# Patient Record
Sex: Female | Born: 1950 | Race: White | Hispanic: No | Marital: Married | Smoking: Former smoker
Health system: Southern US, Community
[De-identification: ages and names within clinical notes are randomized; demographics above are authoritative.]

## PROBLEM LIST (undated history)

## (undated) HISTORY — PX: BREAST EXCISIONAL BIOPSY: SUR124

---

## 1998-09-01 ENCOUNTER — Other Ambulatory Visit: Admission: RE | Admit: 1998-09-01 | Discharge: 1998-09-01 | Payer: Self-pay | Admitting: Family Medicine

## 1998-10-05 ENCOUNTER — Other Ambulatory Visit: Admission: RE | Admit: 1998-10-05 | Discharge: 1998-10-05 | Payer: Self-pay | Admitting: Otolaryngology

## 1998-11-16 ENCOUNTER — Encounter: Payer: Self-pay | Admitting: Otolaryngology

## 1998-11-18 ENCOUNTER — Inpatient Hospital Stay (HOSPITAL_COMMUNITY): Admission: RE | Admit: 1998-11-18 | Discharge: 1998-11-19 | Payer: Self-pay | Admitting: Otolaryngology

## 1999-11-07 ENCOUNTER — Encounter: Admission: RE | Admit: 1999-11-07 | Discharge: 1999-11-07 | Payer: Self-pay | Admitting: Family Medicine

## 1999-11-07 ENCOUNTER — Encounter: Payer: Self-pay | Admitting: Family Medicine

## 2000-09-05 ENCOUNTER — Encounter: Payer: Self-pay | Admitting: Otolaryngology

## 2000-09-05 ENCOUNTER — Encounter: Admission: RE | Admit: 2000-09-05 | Discharge: 2000-09-05 | Payer: Self-pay | Admitting: Otolaryngology

## 2000-11-14 ENCOUNTER — Encounter: Payer: Self-pay | Admitting: Family Medicine

## 2000-11-14 ENCOUNTER — Encounter: Admission: RE | Admit: 2000-11-14 | Discharge: 2000-11-14 | Payer: Self-pay | Admitting: Family Medicine

## 2001-04-07 ENCOUNTER — Ambulatory Visit (HOSPITAL_COMMUNITY): Admission: RE | Admit: 2001-04-07 | Discharge: 2001-04-07 | Payer: Self-pay | Admitting: Gastroenterology

## 2001-04-16 ENCOUNTER — Encounter: Admission: RE | Admit: 2001-04-16 | Discharge: 2001-04-16 | Payer: Self-pay | Admitting: Gastroenterology

## 2001-04-16 ENCOUNTER — Encounter: Payer: Self-pay | Admitting: Gastroenterology

## 2001-04-29 ENCOUNTER — Encounter (INDEPENDENT_AMBULATORY_CARE_PROVIDER_SITE_OTHER): Payer: Self-pay | Admitting: Specialist

## 2001-04-29 ENCOUNTER — Ambulatory Visit (HOSPITAL_COMMUNITY): Admission: RE | Admit: 2001-04-29 | Discharge: 2001-04-29 | Payer: Self-pay | Admitting: Gastroenterology

## 2001-07-01 ENCOUNTER — Encounter: Payer: Self-pay | Admitting: General Surgery

## 2001-07-04 ENCOUNTER — Encounter (INDEPENDENT_AMBULATORY_CARE_PROVIDER_SITE_OTHER): Payer: Self-pay | Admitting: Specialist

## 2001-07-04 ENCOUNTER — Inpatient Hospital Stay (HOSPITAL_COMMUNITY): Admission: RE | Admit: 2001-07-04 | Discharge: 2001-07-10 | Payer: Self-pay | Admitting: General Surgery

## 2001-07-06 ENCOUNTER — Encounter: Payer: Self-pay | Admitting: Surgery

## 2001-11-17 ENCOUNTER — Encounter: Payer: Self-pay | Admitting: Family Medicine

## 2001-11-17 ENCOUNTER — Encounter: Admission: RE | Admit: 2001-11-17 | Discharge: 2001-11-17 | Payer: Self-pay | Admitting: Family Medicine

## 2002-01-29 ENCOUNTER — Inpatient Hospital Stay (HOSPITAL_COMMUNITY): Admission: AD | Admit: 2002-01-29 | Discharge: 2002-01-30 | Payer: Self-pay | Admitting: General Surgery

## 2002-10-02 ENCOUNTER — Encounter: Payer: Self-pay | Admitting: Otolaryngology

## 2002-10-02 ENCOUNTER — Encounter: Admission: RE | Admit: 2002-10-02 | Discharge: 2002-10-02 | Payer: Self-pay | Admitting: Otolaryngology

## 2002-11-23 ENCOUNTER — Encounter: Admission: RE | Admit: 2002-11-23 | Discharge: 2002-11-23 | Payer: Self-pay | Admitting: Family Medicine

## 2002-11-23 ENCOUNTER — Encounter: Payer: Self-pay | Admitting: Family Medicine

## 2003-12-13 ENCOUNTER — Encounter: Admission: RE | Admit: 2003-12-13 | Discharge: 2003-12-13 | Payer: Self-pay | Admitting: Family Medicine

## 2005-01-08 ENCOUNTER — Encounter: Admission: RE | Admit: 2005-01-08 | Discharge: 2005-01-08 | Payer: Self-pay | Admitting: *Deleted

## 2006-01-15 ENCOUNTER — Encounter: Admission: RE | Admit: 2006-01-15 | Discharge: 2006-01-15 | Payer: Self-pay | Admitting: *Deleted

## 2007-01-20 ENCOUNTER — Ambulatory Visit (HOSPITAL_COMMUNITY): Admission: RE | Admit: 2007-01-20 | Discharge: 2007-01-20 | Payer: Self-pay | Admitting: Family Medicine

## 2008-01-22 ENCOUNTER — Ambulatory Visit (HOSPITAL_COMMUNITY): Admission: RE | Admit: 2008-01-22 | Discharge: 2008-01-22 | Payer: Self-pay | Admitting: Family Medicine

## 2009-01-24 ENCOUNTER — Ambulatory Visit (HOSPITAL_COMMUNITY): Admission: RE | Admit: 2009-01-24 | Discharge: 2009-01-24 | Payer: Self-pay | Admitting: Family Medicine

## 2010-01-27 ENCOUNTER — Ambulatory Visit (HOSPITAL_COMMUNITY)
Admission: RE | Admit: 2010-01-27 | Discharge: 2010-01-27 | Payer: Self-pay | Source: Home / Self Care | Attending: Family Medicine | Admitting: Family Medicine

## 2010-06-23 NOTE — Procedures (Signed)
[place]. [facility]  Patient:    Melinda Lam, Melinda Lam Visit Number: [id number] MRN: [id number]          Service Type: END Location: ENDO Attending Physician:  Charna Elizabeth Dictated by:   Anselmo Rod, M.D. Proc. Date: 04/29/01 Admit Date:  04/29/2001   CC:         Jefry H. Pollyann Kennedy, M.D.   Procedure Report  DATE OF BIRTH:  January 12, 1951  REFERRING PHYSICIAN:  Jefry H. Pollyann Kennedy, M.D.  PROCEDURE PERFORMED:  Esophagogastroduodenoscopy with biopsies.  ENDOSCOPIST:  Anselmo Rod, M.D.  INSTRUMENT USED:  Olympus video panendoscope.  INDICATIONS FOR PROCEDURE:  Dysphagia with a history of stricture seen with barium esophagogram in a 60 year old white female with a history of severe reflux, rule out esophagitis.  Dilatation is planned if necessary.  PREPROCEDURE PREPARATION:  Informed consent was procured from the patient. The patient was fasted for eight hours prior to the procedure.  PREPROCEDURE PHYSICAL:  The patient had stable vital signs.  Neck supple. Chest clear to auscultation.  S1, S2 regular.  Abdomen soft with normal abdominal bowel sounds.  DESCRIPTION OF PROCEDURE:  The patient was placed in left lateral decubitus position and sedated with 50 mg of Demerol and 7.5 mg of Versed intravenously. Once the patient was adequately sedated and maintained on low-flow oxygen and continuous cardiac monitoring, the Olympus video panendoscope was advanced through the mouthpiece, over the tongue, into the esophagus under direct vision.  The esophagus appeared widely patent. There was no evidence of ring, stricture, masses, esophagitis or Barretts mucosa.  The scope was then advanced to the stomach.  No esophageal stricture was noted. Antral erosions were noted on examination of the prepyloric area.  Biopsies were done to rule out presence of  Helicobacter pylori by pathology. The proximal small bowel appeared normal.  The rest of the stomach appeared  normal.  IMPRESSION: 1. Widely patent esophagus, no evidence of stricture, esophagitis or Barretts    mucosa. 2. Antral erosions biopsied.  Rule out Helicobacter pylori by pathology. 3. Normal proximal small bowel.  RECOMMENDATION: 1. Continue proton pump inhibitors. 2. Treat with antibiotics if Helicobacter pylori present. 3. Surgical evaluation for Nissen fundoplication. 4. Outpatient follow-up in the next four weeks. Dictated by:   Anselmo Rod, M.D. Attending Physician:  Charna Elizabeth DD:  04/29/01 TD:  04/29/01 Job: [id number]

## 2010-06-23 NOTE — Op Note (Signed)
[facility]  Patient:    Melinda Lam, Melinda Lam Visit Number: [id number] MRN: [id number]          Service Type: SUR Location: 1W 0164 01 Attending Physician:  Arlis Porta Dictated by:   Adolph Pollack, M.D. Proc. Date: 07/04/01 Admit Date:  07/04/2001   CC:         Anselmo Rod, M.D.  Jefry H. Pollyann Kennedy, M.D.  Robert A. Eliezer Lofts., M.D.   Operative Report  PREOPERATIVE DIAGNOSIS:  Hiatal hernia with gastroesophageal reflux.  POSTOPERATIVE DIAGNOSIS:  Hiatal hernia with gastroesophageal reflux.  PROCEDURE:  Laparoscopic converted to open Nissen fundoplication and splenectomy.  SURGEON:  Adolph Pollack, M.D.  ASSISTANT:  Sandria Bales. Ezzard Standing, M.D.  ANESTHESIA:  General.  ESTIMATED BLOOD LOSS:  Approximately 2000 to 2500 cc.  INDICATIONS FOR PROCEDURE:  [name] is a 60 year old female whose been suffering from symptoms of gastroesophageal reflux as well as supra-esophageal manifestations. She is on twice a day Nexium but still has break through symptoms. She has an abnormal demeester score. She has to sleep in a recliner. Manometry does not demonstrate esophageal dismotility. She has elected to have the antireflux surgery.  TECHNIQUE:  She was placed supine on the operating table and a general anesthetic was administered. The abdomen was sterilely prepped and draped. A small incision was made superior to the umbilicus in the midline and carried down through subcutaneous tissue to the fascia. A 1.5 cm incision was made in the midline fascia and a pursestring suture of #0 Vicryl placed around the fascial edges. The peritoneal cavity was entered bluntly and under direct vision. A Hasson trocar was introduced into the peritoneal cavity and pneumoperitoneum was created by insufflation of CO2 gas. The laparoscope was introduced into the peritoneal cavity and a 12 mm trocar was placed in the right lateral abdomen through which a liver  retraction device was inserted. She has enlarged fatty liver. I retracted the left lobe of the liver up to the shoulder exposing the hiatus. Next, a 5 mm trocar was placed in the right upper quadrant followed by an 11 mm trocar in the midline epigastric region. A 5 mm trocar was placed in the left upper quadrant.  The thin gastrohepatic ligament was divided with the harmonic scalpel up to the level of the right crus. I then incised the peritoneum over the esophagus exposing the left crus. Using careful blunt dissection, I separated the esophagus from the right crus. I identified the posterior vagus nerve and left it adherent to the esophagus. I then partially created a retroesophageal window using blunt dissection.  Next, I approached the short gastric vessels. I found an area approximately in the mid fundus and began dividing short gastric vessels using the harmonic scalpel. When I came to the proximal fundus, it was intimately adherent to the spleen. Again carefully dissecting in this area and taking small bites with the harmonic scalpel, significant bleeding was encountered. I initially tried to place a clip on what I felt was a bleeding vessel but this was unsuccessful. I placed a sponge and held direct pressure. I was not able to control the bleeding adequately laparoscopically so I decided to convert the procedure to open.  An upper midline incision was made incising the skin and subcutaneous sharply. The midline fascia was divided with the cautery and the peritoneal cavity entered. I immediately packed off the area that was bleeding. I used self retaining retractors to expose. I then removed the packs  and noted significant bleeding deep near the splenic hilar area. I could not adequately visualize the vessel that the bleeding was coming from and I felt the only way I was going to be able to do this would be to remove the spleen as I could not control the bleeding at this  time.  Next, I held direct pressure over the vessel and tacked it tightly. I allowed the anesthesiologist to resuscitate the patient. She only had one episode of hypotension with blood pressure systolic of 78 but the pulse remained normal during that time. She was typed and crossed for four units of blood and was given two units of blood. Following this, I mobilized the spleen by dividing its lateral attachments. I then clamped the hilar attachments and divided these. I divided the short gastric vessels and removed the spleen. This allowed me adequate exposure to the area. I ligated the splenic vessels and the short gastric vessels. I also placed clips on some of the short gastric vessels. There was a small amount of oozing in the tail of the pancreas and I cauterized this.  Next, I exposed the left crus area and I found a dilated vein that was significantly bleeding. This appeared to be the structure responsible for the bleeding. 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 I clipped it proximally and distally and controlled the bleeding. At this point in time, we had lost probably approximately 2000 cc of blood but receiving 2 units of blood, she remained hemodynamically stable and was making good urine output. Hemoglobin check after the two units of blood was 9.1.  I completed the retroesophageal window and retracted this with a Penrose drain exposing the left and right crus. A small hiatal hernia was noted and was closed with two #0 nonabsorbable sutures. Next, I passed part of the fundus through the retroesophageal window to create a 360 degree wrap which was under no tension. A size 50 dilator was placed into the esophagus and into the stomach. A 360 degree fundoplication was then performed using #0 interrupted sutures. The wrap was 2 cm in length. The proximal sutures incorporated bites  of the left leaf of the wrap, the esophagus and the right leaf of the wrap. The distal suture  incorporated just the right and left leaves of the wrap. I had the dilator removed and the wrap was floppy under no tension. I then had a nasogastric tube placed into the stomach.  I irrigated out the region in the splenic bed. There was a small residual piece of splenic tissue left behind that was not bleeding and I did not remove it. The splenic bed was hemostatic. I then placed a drain into the splenic bed near the tail of the pancreas through one of the 5 mm trocar sites on the left upper quadrant and anchored it to the skin with 3-0 nylon suture. I removed all laparotomy pads and made sure laparotomy, sponge and instrument count was correct. After that had been reported as being correct, the self retaining retractors were removed. I then closed the fascia with a running #1 PDS suture and intermittent #1 Vicryl sutures. The subcutaneous tissue was irrigated. The remaining trocar sites in the midline wound were closed with staples. Sterile dressings were applied.  The patient tolerated the procedure fairly well. 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 However, she was taken to the recovery room  in satisfactory condition. She will spend overnight in the Step-Down Unit for observation. I did explain to her husband midway through the operation what we would need to do and the fact that we had to convert to an open procedure and that I eventually gave her a blood transfusion. Dictated by:   Adolph Pollack, M.D. Attending Physician:  Arlis Porta DD:  07/04/01 TD:  07/05/01 Job: [id number]

## 2010-06-23 NOTE — Op Note (Signed)
NAME:  Melinda Lam, Melinda Lam                       ACCOUNT NO.:  [id number]   MEDICAL RECORD NO.:  [id number]                   PATIENT TYPE:  OBV   LOCATION:  0377                                 FACILITY:  [facility]   PHYSICIAN:  Adolph Pollack, M.D.            DATE OF BIRTH:  [birth year]   DATE OF PROCEDURE:  01/28/2002  DATE OF DISCHARGE:                                 OPERATIVE REPORT   PREOPERATIVE DIAGNOSIS:  Ventral incisional hernia.   POSTOPERATIVE DIAGNOSIS:  Ventral incisional hernia.   PROCEDURE:  Laparoscopic repair of ventral incisional hernia with Gore-Tex  Dual mesh.   SURGEON:  Adolph Pollack, M.D.   ASSISTANT:  Rose Phi. Maple Hudson, M.D.   ANESTHESIA:  General endotracheal.   INDICATION:  The patient is a 60 year old female who had an open Nissen  fundoplication and developed a ventral incisional hernia in the epigastric  region.  The pain has been increasing, and she requests repair.  The  procedure and risks were explained to her preoperatively.   TECHNIQUE:  She was brought to the operating room and placed supine on the  operating table, and a general anesthetic was administered.  A Foley  catheter was the placed in her bladder.  Her abdominal wall was sterilely  prepped and draped.  A small subumbilical incision was made incising the  skin sharply and carrying this down to the midline fascia where a small  incision was made in the midline fascia.  The peritoneal cavity was then  entered under direct vision.  A pursestring suture of 0 Vicryl was placed  around the fascia about this.  A Hasson trocar was introduced into the  peritoneal cavity, and pneumoperitoneum was created by insufflation of CO2  gas.   Next, the laparoscope was introduced, and it appeared that I was subomental.  I was able to find a spot in the right mid-lateral abdomen, and placed a  10/11-mm trocar here and also placed one in the left mid-lateral abdomen.  From the right mid-lateral  abdomen, I was able to visualize the omental  adhesions to the anterior abdominal wall.  I placed a 5-mm trocar in the  right upper quadrant and using careful sharp and blunt dissection, I freed  up the anterior abdominal wall from all the omental adhesions.  There was no  adherent bowel to the abdominal wall.  I also divided part of the falciform  ligament close to the abdominal wall using cautery.  I identified two hernia  defects, one in the supraumbilical area and one in the midepigastric region.  I cleaned off the abdominal wall of the adhesions well around these areas.  I took spinal needles and placed them at the periphery of the hernia in four  quadrants, then measured 3-4 cm out from this drawing a large oval.  I then  brought a piece of Gore-Tex Dual mesh in.  The mesh I used was approximately  17 x 22 cm.  I cut the mesh to fit the oval then placed four anchoring  sutures of 0 Novofil in each of the four quadrants.  I then placed the mesh  into the abdominal cavity through the subumbilical port, and then I closed  the subumbilical port by tightening up and tying down the pursestring  suture.  Intracorporeally, I unfurled the mesh with the smooth side facing  down and the rough side facing up.  In the four quadrants of the hernia  area, I placed four small incisions and brought the anchoring sutures up  over a fascial bridge and approximated the mesh to the anterior abdominal  wall by tightening up and tying down the anchoring sutures.  I then took  spiral tacks and placed a spiral tacks in each quadrant to further anchor  the mesh.   Next, I made small incisions bisecting the previous four quadrant incisions  I had made and placed four more anchoring sutures using the Endoclose device  into the mesh.  I then completed the fixation with more spiral tacks in the  periphery and inner ring.  This allowed for more than adequate coverage of  the two defects.   I inspected the area,  and there was no active bleeding noted.  I did  evacuate approximately 50 cc of some old clot.  I then released the  pneumoperitoneum and watched the mesh approximate itself to the omentum.  I  removed all the trocars.   All skin incisions were then closed with 4-0 Monocryl subcuticular stitches.  Steri-Strips and sterile dressings were applied.   She tolerated the procedure well without any apparent complications and was  taken to the recovery room in satisfactory condition.                                               Adolph Pollack, M.D.    [name]  D:  01/28/2002  T:  01/29/2002  Job:  [id number]

## 2010-06-23 NOTE — Discharge Summary (Signed)
   NAME:  Melinda Lam, Melinda Lam                       ACCOUNT NO.:  [id number]   MEDICAL RECORD NO.:  [id number]                   PATIENT TYPE:  INP   LOCATION:  0377                                 FACILITY:  [facility]   PHYSICIAN:  Adolph Pollack, M.D.            DATE OF BIRTH:  03-Mar-1950   DATE OF ADMISSION:  01/28/2002  DATE OF DISCHARGE:  01/30/2002                                 DISCHARGE SUMMARY   PRIMARY DISCHARGE DIAGNOSIS:  Ventral incisional hernia.   SECONDARY DIAGNOSES:  1. Hypertension.  2. Hiatal hernia with gastroesophageal reflux disease.   PROCEDURE:  Laparoscopic ventral incisional hernia repair with mesh on  January 28, 2002.   REASON FOR ADMISSION:  The patient is a 60 year old female who had an open  Nissan fundoplication for gastroesophageal reflux disease.  She has had  alleviation of her symptoms but has developed a painful incisional hernia  and was admitted for repair.   HOSPITAL COURSE:  She underwent the above procedure without any apparent  complications.  Postoperatively, she did have some pain control issues and  was not comfortable enough to go home on postoperative day #1.  Her Foley  was removed and by postoperative day #2 she was more comfortable, tolerating  a diet, and able to be discharged.   DISPOSITION:  1. Discharged to home on January 30, 2002 in satisfactory condition.  2. She was given Tylox for pain and told to continue her home medications.  3. Discharge instructions were given to her.  4. She will follow up with me in the office.                                               Adolph Pollack, M.D.    [name]  D:  02/09/2002  T:  02/09/2002  Job:  [id number]

## 2010-06-23 NOTE — Discharge Summary (Signed)
[facility]  Patient:    Melinda Lam, Melinda Lam Visit Number: [id number] MRN: [id number]          Service Type: SUR Location: 4W 0464 01 Attending Physician:  Arlis Porta Dictated by:   Adolph Pollack, M.D. Admit Date:  07/04/2001 Discharge Date: 07/10/2001   CC:         Anselmo Rod, M.D.  Jefry H. Pollyann Kennedy, M.D.  Robert A. Eliezer Lofts., M.D.   Discharge Summary  PRINCIPAL DISCHARGE DIAGNOSIS:  Hiatal hernia with gastroesophageal reflux.  SECONDARY DIAGNOSES: 1. Hypertension. 2. Acute blood loss anemia.  PROCEDURE:  Laparoscopic converted to open Nissen fundoplication with splenectomy Jul 04, 2001.  REASON FOR ADMISSION:  The patient is a 60 year old female suffering from gastroesophageal reflux who has failed medical management.  She has some superesophageal manifestations of a reflux as well.  Because of her failure of medical management, she was admitted for elective Nissen fundoplication.  HOSPITAL COURSE:  She underwent the above operation with the complication of a bleeding aberrant left phrenic vessel leading to a significant intraoperative blood loss and requiring transfusion.  In order to expose this bleeding vessel and stop the hemorrhage, the spleen was mobilized and then ended up having to be removed however, a small segment of spleen remained measuring approximately 2-3 cm in diameter.  Postoperatively, she was hypokalemic and this was replaced.  She got 2 units of blood intraoperatively and 2 units postoperatively on the floor.  Her NG tube was removed.  She was started on a liquid diet.  I had ordered meningococcus vaccine, pneumococcus vaccine, and Haemophilus influenzae vaccine.  I was informed by the pharmacy that all vaccines were on back order.  I talked with the [facility] pharmacist and with the [facility] pharmacist.  The [facility] pharmacist and supplier told me that they had pneumococcus vaccine but did not have the  Haemophilus or meningococcus vaccine.  I requested the pneumococcus vaccine be given a second time and the patient informed me that she received it July 09, 2001.  She was given one dose of Procrit and started on iron.  She was improving with respect to her ambulatory status, improving with respect to her oral intake of her liquid diet.  A drain fluid amylase was normal at 48.  Hemoglobin the day before discharge was 9100 with a platelet count of 514,000.  On July 10, 2001, she had continued improvement, was more independent with respect to her ambulatory nature and was felt to be satisfactory for discharge.  Her drain was removed.  DISPOSITION:  Discharged to home on July 10, 2001.  Staples will be removed prior to discharge and Steri-Strips applied.  I gave her multiple instructions including her needing a pneumococcal vaccine every 5 years with the next one due June 2008.  She was given Tylox for pain, Niferex for iron replacement and also ampicillin 500 mg.  I told her if she had febrile illness for more than 24 hours, fever being greater than 101 or 101.5, that she needed to have the ampicillin prescription filled, start taking it and call her primary care doctor.  She will come back and see me in 2 weeks.  Her diet is basically full liquids and "blenderized" food.  She was discharged in satisfactory condition. Dictated by:   Adolph Pollack, M.D. Attending Physician:  Arlis Porta DD:  07/10/01 TD:  07/13/01 Job: [id number]

## 2011-01-08 ENCOUNTER — Other Ambulatory Visit (HOSPITAL_COMMUNITY): Payer: Self-pay | Admitting: Family Medicine

## 2011-01-08 DIAGNOSIS — Z1231 Encounter for screening mammogram for malignant neoplasm of breast: Secondary | ICD-10-CM

## 2011-02-14 ENCOUNTER — Ambulatory Visit (HOSPITAL_COMMUNITY)
Admission: RE | Admit: 2011-02-14 | Discharge: 2011-02-14 | Disposition: A | Discharge status: Home / Self Care | Payer: Self-pay | Source: Ambulatory Visit | Attending: Family Medicine | Admitting: Family Medicine

## 2011-02-14 DIAGNOSIS — Z1231 Encounter for screening mammogram for malignant neoplasm of breast: Secondary | ICD-10-CM

## 2012-04-16 ENCOUNTER — Other Ambulatory Visit: Payer: Self-pay | Admitting: Physician Assistant

## 2012-04-16 ENCOUNTER — Ambulatory Visit
Admission: RE | Admit: 2012-04-16 | Discharge: 2012-04-16 | Disposition: A | Discharge status: Home / Self Care | Payer: BC Managed Care – PPO | Source: Ambulatory Visit | Attending: Physician Assistant | Admitting: Physician Assistant

## 2012-04-16 DIAGNOSIS — R05 Cough: Secondary | ICD-10-CM

## 2012-04-16 DIAGNOSIS — R1030 Lower abdominal pain, unspecified: Secondary | ICD-10-CM

## 2012-04-24 ENCOUNTER — Other Ambulatory Visit (HOSPITAL_COMMUNITY): Payer: Self-pay | Admitting: Family Medicine

## 2012-04-24 DIAGNOSIS — Z1231 Encounter for screening mammogram for malignant neoplasm of breast: Secondary | ICD-10-CM

## 2012-05-01 ENCOUNTER — Ambulatory Visit (HOSPITAL_COMMUNITY)
Admission: RE | Admit: 2012-05-01 | Discharge: 2012-05-01 | Disposition: A | Discharge status: Home / Self Care | Payer: BC Managed Care – PPO | Source: Ambulatory Visit | Attending: Family Medicine | Admitting: Family Medicine

## 2012-05-01 DIAGNOSIS — Z1231 Encounter for screening mammogram for malignant neoplasm of breast: Secondary | ICD-10-CM | POA: Insufficient documentation

## 2012-10-22 ENCOUNTER — Other Ambulatory Visit: Payer: Self-pay | Admitting: Gastroenterology

## 2013-03-31 ENCOUNTER — Other Ambulatory Visit (HOSPITAL_COMMUNITY): Payer: Self-pay | Admitting: Family Medicine

## 2013-03-31 DIAGNOSIS — Z1231 Encounter for screening mammogram for malignant neoplasm of breast: Secondary | ICD-10-CM

## 2013-05-04 ENCOUNTER — Ambulatory Visit (HOSPITAL_COMMUNITY)
Admission: RE | Admit: 2013-05-04 | Discharge: 2013-05-04 | Disposition: A | Discharge status: Home / Self Care | Payer: BC Managed Care – PPO | Source: Ambulatory Visit | Attending: Family Medicine | Admitting: Family Medicine

## 2013-05-04 DIAGNOSIS — Z1231 Encounter for screening mammogram for malignant neoplasm of breast: Secondary | ICD-10-CM | POA: Insufficient documentation

## 2014-01-19 ENCOUNTER — Other Ambulatory Visit: Payer: Self-pay | Admitting: Family Medicine

## 2014-03-30 ENCOUNTER — Other Ambulatory Visit (HOSPITAL_COMMUNITY): Payer: Self-pay | Admitting: Family Medicine

## 2014-03-30 DIAGNOSIS — Z139 Encounter for screening, unspecified: Secondary | ICD-10-CM

## 2014-05-10 ENCOUNTER — Ambulatory Visit (HOSPITAL_COMMUNITY)
Admission: RE | Admit: 2014-05-10 | Discharge: 2014-05-10 | Disposition: A | Discharge status: Home / Self Care | Payer: BLUE CROSS/BLUE SHIELD | Source: Ambulatory Visit | Attending: Family Medicine | Admitting: Family Medicine

## 2014-05-10 DIAGNOSIS — Z1231 Encounter for screening mammogram for malignant neoplasm of breast: Secondary | ICD-10-CM | POA: Diagnosis not present

## 2014-05-10 DIAGNOSIS — Z139 Encounter for screening, unspecified: Secondary | ICD-10-CM

## 2015-04-06 ENCOUNTER — Other Ambulatory Visit: Payer: Self-pay

## 2015-04-06 DIAGNOSIS — Z1231 Encounter for screening mammogram for malignant neoplasm of breast: Secondary | ICD-10-CM

## 2015-05-11 ENCOUNTER — Ambulatory Visit
Admission: RE | Admit: 2015-05-11 | Discharge: 2015-05-11 | Disposition: A | Discharge status: Home / Self Care | Payer: BLUE CROSS/BLUE SHIELD | Source: Ambulatory Visit

## 2015-05-11 DIAGNOSIS — Z1231 Encounter for screening mammogram for malignant neoplasm of breast: Secondary | ICD-10-CM

## 2016-04-02 ENCOUNTER — Other Ambulatory Visit: Payer: Self-pay | Admitting: Family Medicine

## 2016-04-02 DIAGNOSIS — Z1231 Encounter for screening mammogram for malignant neoplasm of breast: Secondary | ICD-10-CM

## 2016-05-14 ENCOUNTER — Ambulatory Visit
Admission: RE | Admit: 2016-05-14 | Discharge: 2016-05-14 | Disposition: A | Discharge status: Home / Self Care | Payer: BLUE CROSS/BLUE SHIELD | Source: Ambulatory Visit | Attending: Family Medicine | Admitting: Family Medicine

## 2016-05-14 DIAGNOSIS — Z1231 Encounter for screening mammogram for malignant neoplasm of breast: Secondary | ICD-10-CM

## 2016-12-25 ENCOUNTER — Other Ambulatory Visit: Payer: Self-pay | Admitting: Family Medicine

## 2016-12-25 DIAGNOSIS — E2839 Other primary ovarian failure: Secondary | ICD-10-CM

## 2016-12-26 ENCOUNTER — Ambulatory Visit
Admission: RE | Admit: 2016-12-26 | Discharge: 2016-12-26 | Disposition: A | Discharge status: Home / Self Care | Payer: BLUE CROSS/BLUE SHIELD | Source: Ambulatory Visit | Attending: Family Medicine | Admitting: Family Medicine

## 2016-12-26 DIAGNOSIS — E2839 Other primary ovarian failure: Secondary | ICD-10-CM

## 2017-04-09 ENCOUNTER — Other Ambulatory Visit: Payer: Self-pay | Admitting: Family Medicine

## 2017-04-09 DIAGNOSIS — Z1231 Encounter for screening mammogram for malignant neoplasm of breast: Secondary | ICD-10-CM

## 2017-05-16 ENCOUNTER — Ambulatory Visit
Admission: RE | Admit: 2017-05-16 | Discharge: 2017-05-16 | Disposition: A | Discharge status: Home / Self Care | Payer: BLUE CROSS/BLUE SHIELD | Source: Ambulatory Visit | Attending: Family Medicine | Admitting: Family Medicine

## 2017-05-16 DIAGNOSIS — Z1231 Encounter for screening mammogram for malignant neoplasm of breast: Secondary | ICD-10-CM

## 2018-04-07 ENCOUNTER — Other Ambulatory Visit: Payer: Self-pay | Admitting: Family Medicine

## 2018-04-07 DIAGNOSIS — Z1231 Encounter for screening mammogram for malignant neoplasm of breast: Secondary | ICD-10-CM

## 2018-05-19 ENCOUNTER — Ambulatory Visit: Payer: Medicare Other

## 2018-07-08 ENCOUNTER — Other Ambulatory Visit: Payer: Self-pay

## 2018-07-08 ENCOUNTER — Ambulatory Visit
Admission: RE | Admit: 2018-07-08 | Discharge: 2018-07-08 | Disposition: A | Discharge status: Home / Self Care | Payer: Medicare Other | Source: Ambulatory Visit | Attending: Family Medicine | Admitting: Family Medicine

## 2018-07-08 DIAGNOSIS — Z1231 Encounter for screening mammogram for malignant neoplasm of breast: Secondary | ICD-10-CM

## 2019-06-01 ENCOUNTER — Other Ambulatory Visit: Payer: Self-pay | Admitting: Family Medicine

## 2019-06-01 DIAGNOSIS — Z1231 Encounter for screening mammogram for malignant neoplasm of breast: Secondary | ICD-10-CM

## 2019-07-09 ENCOUNTER — Ambulatory Visit
Admission: RE | Admit: 2019-07-09 | Discharge: 2019-07-09 | Disposition: A | Discharge status: Home / Self Care | Payer: Medicare Other | Source: Ambulatory Visit | Attending: Family Medicine | Admitting: Family Medicine

## 2019-07-09 ENCOUNTER — Other Ambulatory Visit: Payer: Self-pay

## 2019-07-09 DIAGNOSIS — Z1231 Encounter for screening mammogram for malignant neoplasm of breast: Secondary | ICD-10-CM

## 2020-02-15 DIAGNOSIS — E1169 Type 2 diabetes mellitus with other specified complication: Secondary | ICD-10-CM | POA: Diagnosis not present

## 2020-02-15 DIAGNOSIS — Z1389 Encounter for screening for other disorder: Secondary | ICD-10-CM | POA: Diagnosis not present

## 2020-02-15 DIAGNOSIS — K219 Gastro-esophageal reflux disease without esophagitis: Secondary | ICD-10-CM | POA: Diagnosis not present

## 2020-02-15 DIAGNOSIS — E7800 Pure hypercholesterolemia, unspecified: Secondary | ICD-10-CM | POA: Diagnosis not present

## 2020-02-15 DIAGNOSIS — I10 Essential (primary) hypertension: Secondary | ICD-10-CM | POA: Diagnosis not present

## 2020-02-15 DIAGNOSIS — Z9889 Other specified postprocedural states: Secondary | ICD-10-CM | POA: Diagnosis not present

## 2020-02-15 DIAGNOSIS — J301 Allergic rhinitis due to pollen: Secondary | ICD-10-CM | POA: Diagnosis not present

## 2020-02-15 DIAGNOSIS — Z0000 Encounter for general adult medical examination without abnormal findings: Secondary | ICD-10-CM | POA: Diagnosis not present

## 2020-03-03 DIAGNOSIS — E119 Type 2 diabetes mellitus without complications: Secondary | ICD-10-CM | POA: Diagnosis not present

## 2020-03-03 DIAGNOSIS — H524 Presbyopia: Secondary | ICD-10-CM | POA: Diagnosis not present

## 2020-04-18 DIAGNOSIS — E7800 Pure hypercholesterolemia, unspecified: Secondary | ICD-10-CM | POA: Diagnosis not present

## 2020-04-18 DIAGNOSIS — K219 Gastro-esophageal reflux disease without esophagitis: Secondary | ICD-10-CM | POA: Diagnosis not present

## 2020-04-18 DIAGNOSIS — I10 Essential (primary) hypertension: Secondary | ICD-10-CM | POA: Diagnosis not present

## 2020-04-18 DIAGNOSIS — E1169 Type 2 diabetes mellitus with other specified complication: Secondary | ICD-10-CM | POA: Diagnosis not present

## 2020-06-13 ENCOUNTER — Other Ambulatory Visit: Payer: Self-pay | Admitting: Family Medicine

## 2020-06-13 DIAGNOSIS — Z1231 Encounter for screening mammogram for malignant neoplasm of breast: Secondary | ICD-10-CM

## 2020-07-06 DIAGNOSIS — I10 Essential (primary) hypertension: Secondary | ICD-10-CM | POA: Diagnosis not present

## 2020-07-06 DIAGNOSIS — K219 Gastro-esophageal reflux disease without esophagitis: Secondary | ICD-10-CM | POA: Diagnosis not present

## 2020-07-06 DIAGNOSIS — E7800 Pure hypercholesterolemia, unspecified: Secondary | ICD-10-CM | POA: Diagnosis not present

## 2020-07-06 DIAGNOSIS — E1169 Type 2 diabetes mellitus with other specified complication: Secondary | ICD-10-CM | POA: Diagnosis not present

## 2020-07-12 ENCOUNTER — Ambulatory Visit
Admission: RE | Admit: 2020-07-12 | Discharge: 2020-07-12 | Disposition: A | Discharge status: Home / Self Care | Payer: Medicare Other | Source: Ambulatory Visit | Attending: Family Medicine | Admitting: Family Medicine

## 2020-07-12 ENCOUNTER — Other Ambulatory Visit: Payer: Self-pay

## 2020-07-12 DIAGNOSIS — Z1231 Encounter for screening mammogram for malignant neoplasm of breast: Secondary | ICD-10-CM

## 2020-08-15 DIAGNOSIS — E1169 Type 2 diabetes mellitus with other specified complication: Secondary | ICD-10-CM | POA: Diagnosis not present

## 2020-08-15 DIAGNOSIS — Z23 Encounter for immunization: Secondary | ICD-10-CM | POA: Diagnosis not present

## 2020-08-15 DIAGNOSIS — Z9889 Other specified postprocedural states: Secondary | ICD-10-CM | POA: Diagnosis not present

## 2020-08-15 DIAGNOSIS — K219 Gastro-esophageal reflux disease without esophagitis: Secondary | ICD-10-CM | POA: Diagnosis not present

## 2020-08-15 DIAGNOSIS — I10 Essential (primary) hypertension: Secondary | ICD-10-CM | POA: Diagnosis not present

## 2020-08-15 DIAGNOSIS — J301 Allergic rhinitis due to pollen: Secondary | ICD-10-CM | POA: Diagnosis not present

## 2020-08-15 DIAGNOSIS — E7800 Pure hypercholesterolemia, unspecified: Secondary | ICD-10-CM | POA: Diagnosis not present

## 2020-10-05 DIAGNOSIS — E7800 Pure hypercholesterolemia, unspecified: Secondary | ICD-10-CM | POA: Diagnosis not present

## 2020-10-05 DIAGNOSIS — K219 Gastro-esophageal reflux disease without esophagitis: Secondary | ICD-10-CM | POA: Diagnosis not present

## 2020-10-05 DIAGNOSIS — I10 Essential (primary) hypertension: Secondary | ICD-10-CM | POA: Diagnosis not present

## 2020-10-05 DIAGNOSIS — E1169 Type 2 diabetes mellitus with other specified complication: Secondary | ICD-10-CM | POA: Diagnosis not present

## 2021-01-18 DIAGNOSIS — I10 Essential (primary) hypertension: Secondary | ICD-10-CM | POA: Diagnosis not present

## 2021-01-18 DIAGNOSIS — E1169 Type 2 diabetes mellitus with other specified complication: Secondary | ICD-10-CM | POA: Diagnosis not present

## 2021-01-18 DIAGNOSIS — E7800 Pure hypercholesterolemia, unspecified: Secondary | ICD-10-CM | POA: Diagnosis not present

## 2021-01-18 DIAGNOSIS — K219 Gastro-esophageal reflux disease without esophagitis: Secondary | ICD-10-CM | POA: Diagnosis not present

## 2021-03-06 DIAGNOSIS — H353131 Nonexudative age-related macular degeneration, bilateral, early dry stage: Secondary | ICD-10-CM | POA: Diagnosis not present

## 2021-03-06 DIAGNOSIS — E119 Type 2 diabetes mellitus without complications: Secondary | ICD-10-CM | POA: Diagnosis not present

## 2021-03-06 DIAGNOSIS — H5213 Myopia, bilateral: Secondary | ICD-10-CM | POA: Diagnosis not present

## 2021-03-08 DIAGNOSIS — I10 Essential (primary) hypertension: Secondary | ICD-10-CM | POA: Diagnosis not present

## 2021-03-08 DIAGNOSIS — E1169 Type 2 diabetes mellitus with other specified complication: Secondary | ICD-10-CM | POA: Diagnosis not present

## 2021-03-08 DIAGNOSIS — Z0000 Encounter for general adult medical examination without abnormal findings: Secondary | ICD-10-CM | POA: Diagnosis not present

## 2021-03-08 DIAGNOSIS — Z9889 Other specified postprocedural states: Secondary | ICD-10-CM | POA: Diagnosis not present

## 2021-03-08 DIAGNOSIS — K219 Gastro-esophageal reflux disease without esophagitis: Secondary | ICD-10-CM | POA: Diagnosis not present

## 2021-03-08 DIAGNOSIS — J301 Allergic rhinitis due to pollen: Secondary | ICD-10-CM | POA: Diagnosis not present

## 2021-03-08 DIAGNOSIS — Z1389 Encounter for screening for other disorder: Secondary | ICD-10-CM | POA: Diagnosis not present

## 2021-03-08 DIAGNOSIS — Z23 Encounter for immunization: Secondary | ICD-10-CM | POA: Diagnosis not present

## 2021-03-08 DIAGNOSIS — E7800 Pure hypercholesterolemia, unspecified: Secondary | ICD-10-CM | POA: Diagnosis not present

## 2021-03-10 ENCOUNTER — Other Ambulatory Visit: Payer: Self-pay | Admitting: Family Medicine

## 2021-03-10 DIAGNOSIS — E2839 Other primary ovarian failure: Secondary | ICD-10-CM

## 2021-04-04 ENCOUNTER — Other Ambulatory Visit: Payer: Self-pay | Admitting: Family Medicine

## 2021-04-04 DIAGNOSIS — Z1231 Encounter for screening mammogram for malignant neoplasm of breast: Secondary | ICD-10-CM

## 2021-05-30 DIAGNOSIS — L218 Other seborrheic dermatitis: Secondary | ICD-10-CM | POA: Diagnosis not present

## 2021-05-30 DIAGNOSIS — Z1283 Encounter for screening for malignant neoplasm of skin: Secondary | ICD-10-CM | POA: Diagnosis not present

## 2021-05-30 DIAGNOSIS — L821 Other seborrheic keratosis: Secondary | ICD-10-CM | POA: Diagnosis not present

## 2021-05-30 DIAGNOSIS — Z85828 Personal history of other malignant neoplasm of skin: Secondary | ICD-10-CM | POA: Diagnosis not present

## 2021-05-30 DIAGNOSIS — Z08 Encounter for follow-up examination after completed treatment for malignant neoplasm: Secondary | ICD-10-CM | POA: Diagnosis not present

## 2021-06-20 DIAGNOSIS — K648 Other hemorrhoids: Secondary | ICD-10-CM | POA: Diagnosis not present

## 2021-06-20 DIAGNOSIS — K639 Disease of intestine, unspecified: Secondary | ICD-10-CM | POA: Diagnosis not present

## 2021-07-13 ENCOUNTER — Ambulatory Visit
Admission: RE | Admit: 2021-07-13 | Discharge: 2021-07-13 | Disposition: A | Discharge status: Home / Self Care | Payer: Medicare Other | Source: Ambulatory Visit | Attending: Family Medicine | Admitting: Family Medicine

## 2021-07-13 DIAGNOSIS — Z1231 Encounter for screening mammogram for malignant neoplasm of breast: Secondary | ICD-10-CM | POA: Diagnosis not present

## 2021-08-07 DIAGNOSIS — K648 Other hemorrhoids: Secondary | ICD-10-CM | POA: Diagnosis not present

## 2021-08-07 DIAGNOSIS — Z86010 Personal history of colonic polyps: Secondary | ICD-10-CM | POA: Diagnosis not present

## 2021-08-07 DIAGNOSIS — R194 Change in bowel habit: Secondary | ICD-10-CM | POA: Diagnosis not present

## 2021-08-07 DIAGNOSIS — K219 Gastro-esophageal reflux disease without esophagitis: Secondary | ICD-10-CM | POA: Diagnosis not present

## 2021-08-29 ENCOUNTER — Ambulatory Visit
Admission: RE | Admit: 2021-08-29 | Discharge: 2021-08-29 | Disposition: A | Discharge status: Home / Self Care | Payer: Medicare Other | Source: Ambulatory Visit | Attending: Family Medicine | Admitting: Family Medicine

## 2021-08-29 DIAGNOSIS — Z780 Asymptomatic menopausal state: Secondary | ICD-10-CM | POA: Diagnosis not present

## 2021-08-29 DIAGNOSIS — E2839 Other primary ovarian failure: Secondary | ICD-10-CM

## 2021-09-19 DIAGNOSIS — K219 Gastro-esophageal reflux disease without esophagitis: Secondary | ICD-10-CM | POA: Diagnosis not present

## 2021-09-19 DIAGNOSIS — E7800 Pure hypercholesterolemia, unspecified: Secondary | ICD-10-CM | POA: Diagnosis not present

## 2021-09-19 DIAGNOSIS — E1169 Type 2 diabetes mellitus with other specified complication: Secondary | ICD-10-CM | POA: Diagnosis not present

## 2021-09-19 DIAGNOSIS — I10 Essential (primary) hypertension: Secondary | ICD-10-CM | POA: Diagnosis not present

## 2021-09-19 DIAGNOSIS — J301 Allergic rhinitis due to pollen: Secondary | ICD-10-CM | POA: Diagnosis not present

## 2021-09-19 DIAGNOSIS — Z9889 Other specified postprocedural states: Secondary | ICD-10-CM | POA: Diagnosis not present

## 2021-10-11 DIAGNOSIS — K5730 Diverticulosis of large intestine without perforation or abscess without bleeding: Secondary | ICD-10-CM | POA: Diagnosis not present

## 2021-10-11 DIAGNOSIS — D120 Benign neoplasm of cecum: Secondary | ICD-10-CM | POA: Diagnosis not present

## 2021-10-11 DIAGNOSIS — Z09 Encounter for follow-up examination after completed treatment for conditions other than malignant neoplasm: Secondary | ICD-10-CM | POA: Diagnosis not present

## 2021-10-11 DIAGNOSIS — Z86010 Personal history of colonic polyps: Secondary | ICD-10-CM | POA: Diagnosis not present

## 2021-10-11 DIAGNOSIS — K648 Other hemorrhoids: Secondary | ICD-10-CM | POA: Diagnosis not present

## 2021-10-13 DIAGNOSIS — D120 Benign neoplasm of cecum: Secondary | ICD-10-CM | POA: Diagnosis not present

## 2021-11-07 DIAGNOSIS — K639 Disease of intestine, unspecified: Secondary | ICD-10-CM | POA: Diagnosis not present

## 2021-11-07 DIAGNOSIS — K219 Gastro-esophageal reflux disease without esophagitis: Secondary | ICD-10-CM | POA: Diagnosis not present

## 2021-11-07 DIAGNOSIS — K648 Other hemorrhoids: Secondary | ICD-10-CM | POA: Diagnosis not present

## 2021-11-28 DIAGNOSIS — X32XXXD Exposure to sunlight, subsequent encounter: Secondary | ICD-10-CM | POA: Diagnosis not present

## 2021-11-28 DIAGNOSIS — Z85828 Personal history of other malignant neoplasm of skin: Secondary | ICD-10-CM | POA: Diagnosis not present

## 2021-11-28 DIAGNOSIS — L570 Actinic keratosis: Secondary | ICD-10-CM | POA: Diagnosis not present

## 2021-11-28 DIAGNOSIS — L728 Other follicular cysts of the skin and subcutaneous tissue: Secondary | ICD-10-CM | POA: Diagnosis not present

## 2021-11-28 DIAGNOSIS — Z08 Encounter for follow-up examination after completed treatment for malignant neoplasm: Secondary | ICD-10-CM | POA: Diagnosis not present

## 2022-03-08 DIAGNOSIS — H35363 Drusen (degenerative) of macula, bilateral: Secondary | ICD-10-CM | POA: Diagnosis not present

## 2022-03-08 DIAGNOSIS — E119 Type 2 diabetes mellitus without complications: Secondary | ICD-10-CM | POA: Diagnosis not present

## 2022-03-08 DIAGNOSIS — H52203 Unspecified astigmatism, bilateral: Secondary | ICD-10-CM | POA: Diagnosis not present

## 2022-03-08 DIAGNOSIS — H10413 Chronic giant papillary conjunctivitis, bilateral: Secondary | ICD-10-CM | POA: Diagnosis not present

## 2022-03-13 DIAGNOSIS — I10 Essential (primary) hypertension: Secondary | ICD-10-CM | POA: Diagnosis not present

## 2022-03-13 DIAGNOSIS — E7800 Pure hypercholesterolemia, unspecified: Secondary | ICD-10-CM | POA: Diagnosis not present

## 2022-03-13 DIAGNOSIS — E119 Type 2 diabetes mellitus without complications: Secondary | ICD-10-CM | POA: Diagnosis not present

## 2022-03-13 DIAGNOSIS — Z9889 Other specified postprocedural states: Secondary | ICD-10-CM | POA: Diagnosis not present

## 2022-03-13 DIAGNOSIS — Z0000 Encounter for general adult medical examination without abnormal findings: Secondary | ICD-10-CM | POA: Diagnosis not present

## 2022-03-13 DIAGNOSIS — E1169 Type 2 diabetes mellitus with other specified complication: Secondary | ICD-10-CM | POA: Diagnosis not present

## 2022-05-22 DIAGNOSIS — J309 Allergic rhinitis, unspecified: Secondary | ICD-10-CM | POA: Diagnosis not present

## 2022-05-22 DIAGNOSIS — R42 Dizziness and giddiness: Secondary | ICD-10-CM | POA: Diagnosis not present

## 2022-05-22 DIAGNOSIS — I10 Essential (primary) hypertension: Secondary | ICD-10-CM | POA: Diagnosis not present

## 2022-05-29 DIAGNOSIS — I10 Essential (primary) hypertension: Secondary | ICD-10-CM | POA: Diagnosis not present

## 2022-05-29 DIAGNOSIS — J309 Allergic rhinitis, unspecified: Secondary | ICD-10-CM | POA: Diagnosis not present

## 2022-05-29 DIAGNOSIS — E119 Type 2 diabetes mellitus without complications: Secondary | ICD-10-CM | POA: Diagnosis not present

## 2022-05-29 DIAGNOSIS — R42 Dizziness and giddiness: Secondary | ICD-10-CM | POA: Diagnosis not present

## 2022-06-04 ENCOUNTER — Other Ambulatory Visit: Payer: Self-pay | Admitting: Family Medicine

## 2022-06-04 DIAGNOSIS — Z1231 Encounter for screening mammogram for malignant neoplasm of breast: Secondary | ICD-10-CM

## 2022-06-15 DIAGNOSIS — I10 Essential (primary) hypertension: Secondary | ICD-10-CM | POA: Diagnosis not present

## 2022-06-15 DIAGNOSIS — R42 Dizziness and giddiness: Secondary | ICD-10-CM | POA: Diagnosis not present

## 2022-07-06 DIAGNOSIS — I10 Essential (primary) hypertension: Secondary | ICD-10-CM | POA: Diagnosis not present

## 2022-07-06 DIAGNOSIS — R42 Dizziness and giddiness: Secondary | ICD-10-CM | POA: Diagnosis not present

## 2022-07-06 DIAGNOSIS — J309 Allergic rhinitis, unspecified: Secondary | ICD-10-CM | POA: Diagnosis not present

## 2022-07-17 ENCOUNTER — Ambulatory Visit
Admission: RE | Admit: 2022-07-17 | Discharge: 2022-07-17 | Disposition: A | Discharge status: Home / Self Care | Payer: Medicare Other | Source: Ambulatory Visit | Attending: Family Medicine | Admitting: Family Medicine

## 2022-07-17 DIAGNOSIS — Z1231 Encounter for screening mammogram for malignant neoplasm of breast: Secondary | ICD-10-CM

## 2022-07-24 DIAGNOSIS — E119 Type 2 diabetes mellitus without complications: Secondary | ICD-10-CM | POA: Diagnosis not present

## 2022-07-24 DIAGNOSIS — H353131 Nonexudative age-related macular degeneration, bilateral, early dry stage: Secondary | ICD-10-CM | POA: Diagnosis not present

## 2022-08-08 ENCOUNTER — Ambulatory Visit: Payer: Medicare Other | Admitting: Neurology

## 2022-08-08 ENCOUNTER — Encounter: Payer: Self-pay | Admitting: Neurology

## 2022-08-08 VITALS — BP 151/90 | HR 75 | Ht 64.0 in | Wt 179.5 lb

## 2022-08-08 DIAGNOSIS — H903 Sensorineural hearing loss, bilateral: Secondary | ICD-10-CM | POA: Diagnosis not present

## 2022-08-08 DIAGNOSIS — H8112 Benign paroxysmal vertigo, left ear: Secondary | ICD-10-CM

## 2022-08-08 NOTE — Progress Notes (Signed)
[facility]  PATIENT: Melinda Lam DOB: Jul 02, 1950  REFERRING DOCTOR OR PCP: Deatra James MD SOURCE: Patient, note from primary care,  _________________________________   HISTORICAL  CHIEF COMPLAINT:  Chief Complaint  Patient presents with   Room 11    Pt is here with her Husband.  Pt states that her dizziness started in March of this. Pt states that she  had gotten her blood pressure medication from a new company and that's when her dizziness started.     HISTORY OF PRESENT ILLNESS:  I had the pleasure of seeing your patient, Melinda Lam, at [facility] for neurologic consultation regarding her vertigo.  She is a 72-year woman who has had vertigo with a sensation of being pulled back or of spinning since March.   She denies any precipitating event before the onset of symptoms, however movements will now precipitate an episode.  She cannot bend over without one occurring.    Episodes last about 30 seconds.   She notes some stuffed up feeling and tinnitus but this is bilateral.   She denies any change in hearing.     She denies similar episodes in the past.   She feels she is better than she was in March but spells are still frequent with bending.  She has not had any imaging studies.   She has not doe vestibular therapy, other exercises or seen ENT.     She has HTN, NIDDM, GERD.  She noted that a different generic is being used for one of her blood pressure medications.  She notes some anxiety.   REVIEW OF SYSTEMS: Constitutional: No fevers, chills, sweats, or change in appetite Eyes: No visual changes, double vision, eye pain Ear, nose and throat: No hearing loss, ear pain, nasal congestion, sore throat Cardiovascular: No chest pain, palpitations Respiratory:  No shortness of breath at rest or with exertion.   No wheezes GastrointestinaI: No nausea, vomiting, diarrhea, abdominal pain, fecal incontinence Genitourinary:  No dysuria, urinary  retention or frequency.  No nocturia. Musculoskeletal:  No neck pain, back pain Integumentary: No rash, pruritus, skin lesions Neurological: as above Psychiatric: No depression at this time.  No anxiety Endocrine: No palpitations, diaphoresis, change in appetite, change in weigh or increased thirst Hematologic/Lymphatic:  No anemia, purpura, petechiae. Allergic/Immunologic: No itchy/runny eyes, nasal congestion, recent allergic reactions, rashes  ALLERGIES: Allergies  Allergen Reactions   Diphenhydramine Hcl Shortness Of Breath   Morphine Anaphylaxis and Itching   Atorvastatin     HOME MEDICATIONS:  Current Outpatient Medications:    acetaminophen (TYLENOL) 500 MG tablet, Take 500 mg by mouth every 6 (six) hours as needed., Disp: , Rfl:    Blood Glucose Monitoring Suppl (ACCU-CHEK AVIVA PLUS) w/Device KIT, Accu-Chek Aviva Plus Meter, Disp: , Rfl:    calcium carbonate (CALCIUM 600) 600 MG TABS tablet, Take 600 mg by mouth daily., Disp: , Rfl:    Cholecalciferol (VITAMIN D3) 50 MCG (2000 UT) TABS, Take by mouth., Disp: , Rfl:    cyanocobalamin (VITAMIN B12) 1000 MCG tablet, Take 1,000 mcg by mouth daily., Disp: , Rfl:    famotidine (PEPCID) 20 MG tablet, Take 20 mg by mouth 2 (two) times daily., Disp: , Rfl:    fexofenadine (ALLEGRA) 180 MG tablet, Take 180 mg by mouth daily., Disp: , Rfl:    fluticasone (FLONASE) 50 MCG/ACT nasal spray, Place 1 spray into both nostrils daily., Disp: , Rfl:    irbesartan (AVAPRO) 75 MG tablet, Take 75 mg  by mouth daily., Disp: , Rfl:    metFORMIN (GLUCOPHAGE) 1000 MG tablet, Take 1,000 mg by mouth 2 (two) times daily with a meal., Disp: , Rfl:    omeprazole (PRILOSEC) 20 MG capsule, Take 20 mg by mouth daily., Disp: , Rfl:    Polyethyl Glycol-Propyl Glycol (SYSTANE) 0.4-0.3 % SOLN, Apply to eye., Disp: , Rfl:    rosuvastatin (CRESTOR) 20 MG tablet, Take 20 mg by mouth daily., Disp: , Rfl:    Wheat Dextrin (BENEFIBER DRINK MIX PO), Take by mouth., Disp:  , Rfl:   PAST MEDICAL HISTORY: History reviewed. No pertinent past medical history.  PAST SURGICAL HISTORY: Past Surgical History:  Procedure Laterality Date   BREAST EXCISIONAL BIOPSY Right 1989, 1994    FAMILY HISTORY: Family History  Problem Relation Age of Onset   Breast cancer Neg Hx     SOCIAL HISTORY: Social History   Socioeconomic History   Marital status: Married    Spouse name: Fayrene Fearing   Number of children: 2   Years of education: 12   Highest education level: 12th grade  Occupational History   Not on file  Tobacco Use   Smoking status: Former    Types: Cigarettes   Smokeless tobacco: Never  Vaping Use   Vaping Use: Never used  Substance and Sexual Activity   Alcohol use: Never   Drug use: Never   Sexual activity: Not on file  Other Topics Concern   Not on file  Social History Narrative   Right Handed   3 Sodas per Day   Social Determinants of Health   Financial Resource Strain: Not on file  Food Insecurity: Not on file  Transportation Needs: Not on file  Physical Activity: Not on file  Stress: Not on file  Social Connections: Not on file  Intimate Partner Violence: Not on file       PHYSICAL EXAM  Vitals:   08/08/22 1041  BP: (!) 151/90  Pulse: 75  Weight: 179 lb 8 oz (81.4 kg)  Height: 5\' 4"  (1.626 m)    Body mass index is 30.81 kg/m.   General: The patient is well-developed and well-nourished and in no acute distress  HEENT:  Head is Golden/AT.  Sclera are anicteric.    Neck: No carotid bruits are noted.  The neck is nontender.  Cardiovascular: The heart has a regular rate and rhythm with a normal S1 and S2. There were no murmurs, gallops or rubs.    Skin: Extremities are without rash or  edema.  Musculoskeletal:  Back is nontender  Neurologic Exam  Mental status: The patient is alert and oriented x 3 at the time of the examination. The patient has apparent normal recent and remote memory, with an apparently normal attention  span and concentration ability.   Speech is normal.  Cranial nerves: Extraocular movements are full. Pupils are equal, round, and reactive to light and accomodation.  Visual fields are full.  Facial symmetry is present. There is good facial sensation to soft touch bilaterally.Facial strength is normal.  Trapezius and sternocleidomastoid strength is normal. No dysarthria is noted.  The tongue is midline, and the patient has symmetric elevation of the soft palate. Mild reduced hearing on the right.  Weber did not lateralize.     Motor:  Muscle bulk is normal.   Tone is normal. Strength is  5 / 5 in all 4 extremities.   Sensory: Sensory testing is intact to pinprick, soft touch and vibration sensation in all  4 extremities.  Coordination: Cerebellar testing reveals good finger-nose-finger and heel-to-shin bilaterally.  Gait and station: Station is normal.   Gait is normal. Tandem gait is normal. Romberg is negative.   Reflexes: Deep tendon reflexes are symmetric and 3 at knees and 2 at ankles and arms.  Plantar responses are flexor.  Dix-Hallpike maneuver to the left elicited pain mild vertigo but no nystagmus.     ASSESSMENT AND PLAN  Benign paroxysmal positional vertigo of left ear  Hearing loss, sensorineural, asymmetrical   In summary, Melinda Lam is a 72 year old woman with episode of dizziness with characteristics consistent with benign paroxysmal positional vertigo.  Symptoms have improved some compared to March though she still has daily episodes.  She has slight reduced hearing on the right compared to the left and sometimes notes some tinnitus bilaterally.  We did the Dix-Hallpike maneuver and she felt a little dizzy towards the left though there was no evoked nystagmus.  I proceeded to do an Epley maneuver started on the left.  I also instructed her and her husband on the Brandt-Daroff exercises and advised them to do this for 5 minutes 3 times a day.  Hopefully between the Epley  maneuver and the Brandt-Daroff exercises she will be much better.  However, if she does not note any significant improvement in 2 weeks she should give [place] a call and then I will want to check an MRI of the internal auditory canals to make sure that she does not have another process such as a schwannoma.  I did not schedule follow-up but if symptoms persist we will see her back or refer based on the results of the MRI.  She or family should call us if she has new or worsening neurologic symptoms.  Thank you for asking me to see Melinda Lam.  Please let me know if I can be of further assistance with her or other patients in the future.  Murrell Dome A. Epimenio Foot, MD, [facility] 08/08/2022, 11:05 AM Certified in Neurology, Clinical Neurophysiology, Sleep Medicine and Neuroimaging  [facility] [address] [place], [place] [address] [phone]

## 2022-09-11 DIAGNOSIS — H8110 Benign paroxysmal vertigo, unspecified ear: Secondary | ICD-10-CM | POA: Diagnosis not present

## 2022-09-11 DIAGNOSIS — I10 Essential (primary) hypertension: Secondary | ICD-10-CM | POA: Diagnosis not present

## 2022-09-11 DIAGNOSIS — E7800 Pure hypercholesterolemia, unspecified: Secondary | ICD-10-CM | POA: Diagnosis not present

## 2022-09-11 DIAGNOSIS — E119 Type 2 diabetes mellitus without complications: Secondary | ICD-10-CM | POA: Diagnosis not present

## 2022-09-11 DIAGNOSIS — J309 Allergic rhinitis, unspecified: Secondary | ICD-10-CM | POA: Diagnosis not present

## 2022-09-11 DIAGNOSIS — K219 Gastro-esophageal reflux disease without esophagitis: Secondary | ICD-10-CM | POA: Diagnosis not present

## 2022-11-07 DIAGNOSIS — Z1283 Encounter for screening for malignant neoplasm of skin: Secondary | ICD-10-CM | POA: Diagnosis not present

## 2022-11-07 DIAGNOSIS — D225 Melanocytic nevi of trunk: Secondary | ICD-10-CM | POA: Diagnosis not present

## 2022-11-07 DIAGNOSIS — L218 Other seborrheic dermatitis: Secondary | ICD-10-CM | POA: Diagnosis not present

## 2023-03-11 DIAGNOSIS — H52203 Unspecified astigmatism, bilateral: Secondary | ICD-10-CM | POA: Diagnosis not present

## 2023-03-11 DIAGNOSIS — E119 Type 2 diabetes mellitus without complications: Secondary | ICD-10-CM | POA: Diagnosis not present

## 2023-04-08 DIAGNOSIS — Z0000 Encounter for general adult medical examination without abnormal findings: Secondary | ICD-10-CM | POA: Diagnosis not present

## 2023-04-08 DIAGNOSIS — E7800 Pure hypercholesterolemia, unspecified: Secondary | ICD-10-CM | POA: Diagnosis not present

## 2023-04-08 DIAGNOSIS — I10 Essential (primary) hypertension: Secondary | ICD-10-CM | POA: Diagnosis not present

## 2023-04-08 DIAGNOSIS — K649 Unspecified hemorrhoids: Secondary | ICD-10-CM | POA: Diagnosis not present

## 2023-04-08 DIAGNOSIS — E119 Type 2 diabetes mellitus without complications: Secondary | ICD-10-CM | POA: Diagnosis not present

## 2023-06-10 DIAGNOSIS — Z85828 Personal history of other malignant neoplasm of skin: Secondary | ICD-10-CM | POA: Diagnosis not present

## 2023-06-10 DIAGNOSIS — Z1283 Encounter for screening for malignant neoplasm of skin: Secondary | ICD-10-CM | POA: Diagnosis not present

## 2023-06-10 DIAGNOSIS — Z08 Encounter for follow-up examination after completed treatment for malignant neoplasm: Secondary | ICD-10-CM | POA: Diagnosis not present

## 2023-06-10 DIAGNOSIS — D225 Melanocytic nevi of trunk: Secondary | ICD-10-CM | POA: Diagnosis not present

## 2023-06-11 ENCOUNTER — Other Ambulatory Visit: Payer: Self-pay | Admitting: Family Medicine

## 2023-06-11 DIAGNOSIS — Z1231 Encounter for screening mammogram for malignant neoplasm of breast: Secondary | ICD-10-CM

## 2023-07-18 ENCOUNTER — Ambulatory Visit
Admission: RE | Admit: 2023-07-18 | Discharge: 2023-07-18 | Disposition: A | Discharge status: Home / Self Care | Source: Ambulatory Visit | Attending: Family Medicine | Admitting: Family Medicine

## 2023-07-18 DIAGNOSIS — Z1231 Encounter for screening mammogram for malignant neoplasm of breast: Secondary | ICD-10-CM

## 2023-10-09 DIAGNOSIS — E7800 Pure hypercholesterolemia, unspecified: Secondary | ICD-10-CM | POA: Diagnosis not present

## 2023-10-09 DIAGNOSIS — L989 Disorder of the skin and subcutaneous tissue, unspecified: Secondary | ICD-10-CM | POA: Diagnosis not present

## 2023-10-09 DIAGNOSIS — E119 Type 2 diabetes mellitus without complications: Secondary | ICD-10-CM | POA: Diagnosis not present

## 2023-10-09 DIAGNOSIS — I10 Essential (primary) hypertension: Secondary | ICD-10-CM | POA: Diagnosis not present

## 2023-10-09 DIAGNOSIS — K219 Gastro-esophageal reflux disease without esophagitis: Secondary | ICD-10-CM | POA: Diagnosis not present

## 2023-10-09 DIAGNOSIS — J309 Allergic rhinitis, unspecified: Secondary | ICD-10-CM | POA: Diagnosis not present

## 2024-04-10 IMAGING — MG MM DIGITAL SCREENING BILAT W/ TOMO AND CAD
8 series · 8 of 24 positions shown · non-contrast
Comparison: Previous exam(s).

CLINICAL DATA: Screening.

EXAM:
DIGITAL SCREENING BILATERAL MAMMOGRAM WITH TOMOSYNTHESIS AND CAD
TECHNIQUE: Bilateral screening digital craniocaudal and mediolateral oblique
mammograms were obtained. Bilateral screening digital breast
tomosynthesis was performed. The images were evaluated with
computer-aided detection.

[R MLO synth-2D]
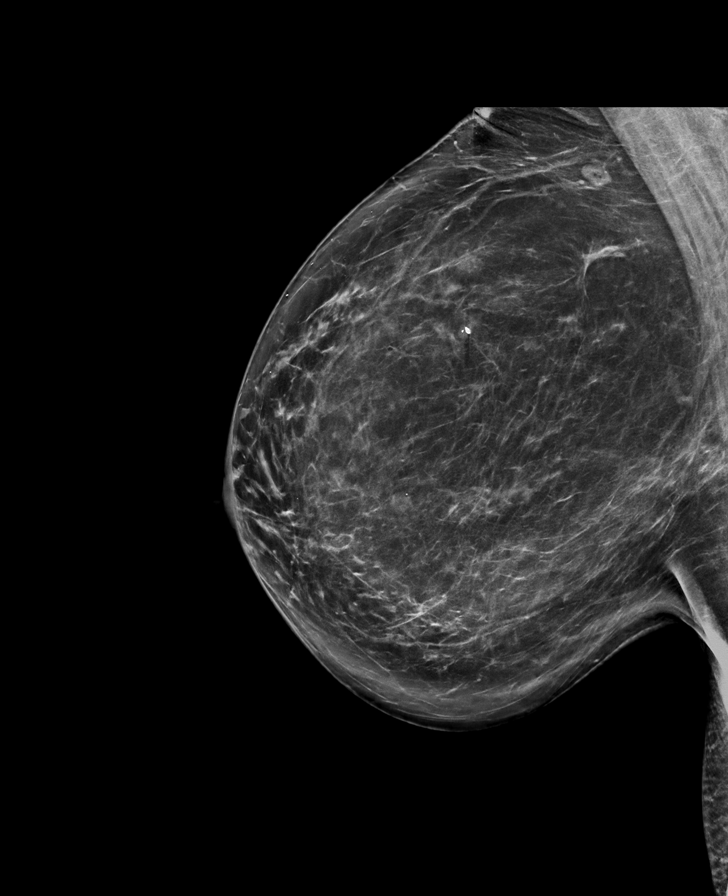

[R CC synth-2D]
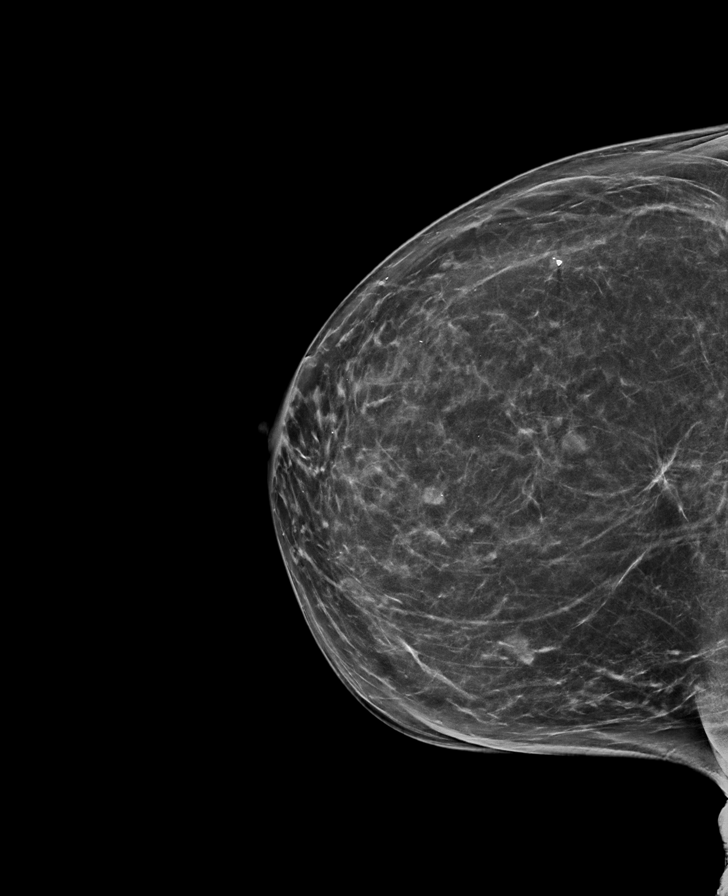

[L CC synth-2D]
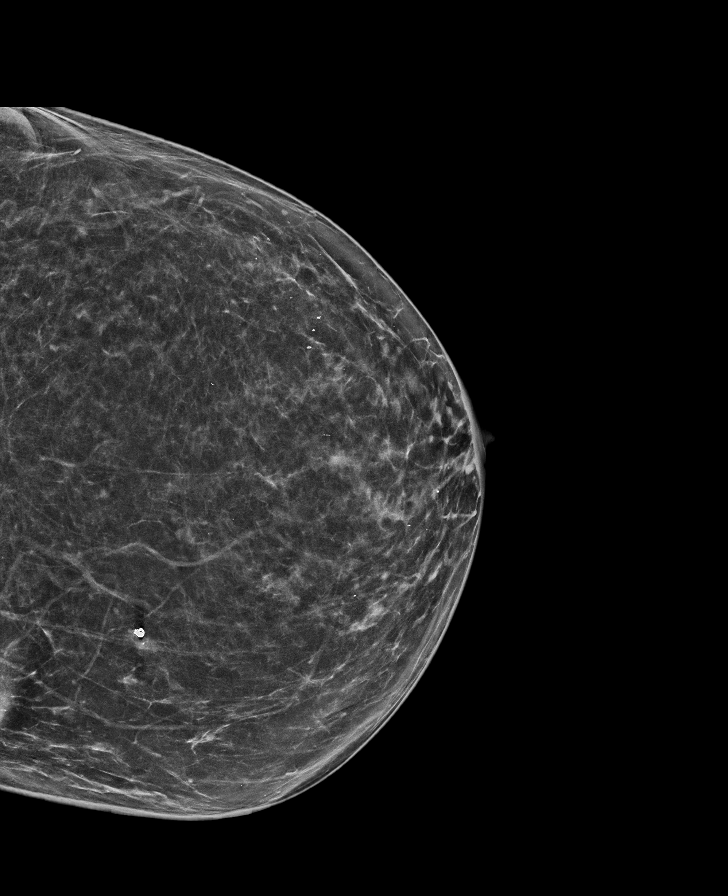

[L MLO synth-2D]
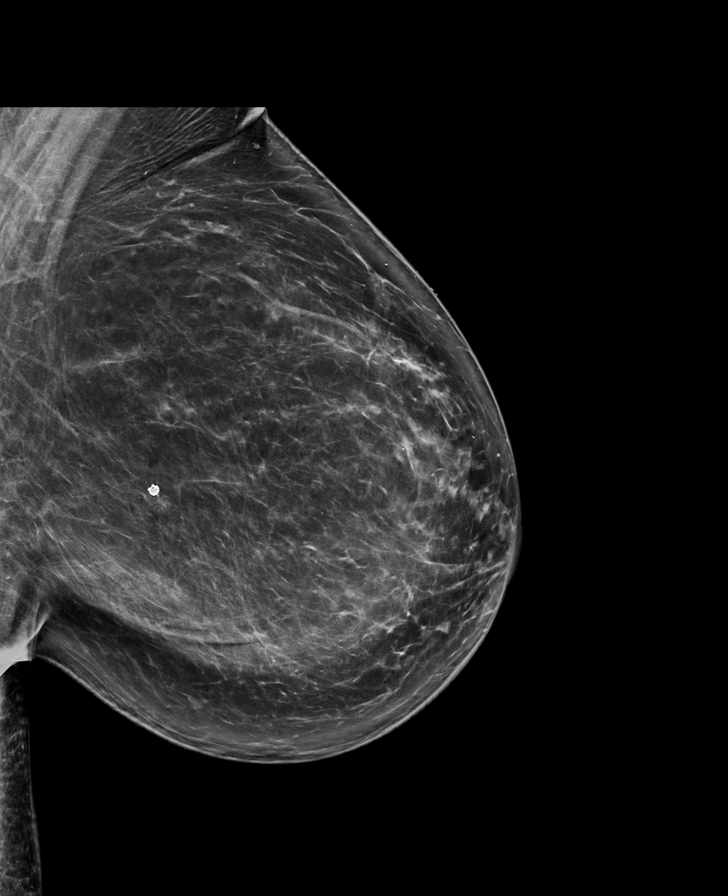

[R CC tomo · tomo slice 39/76.0]
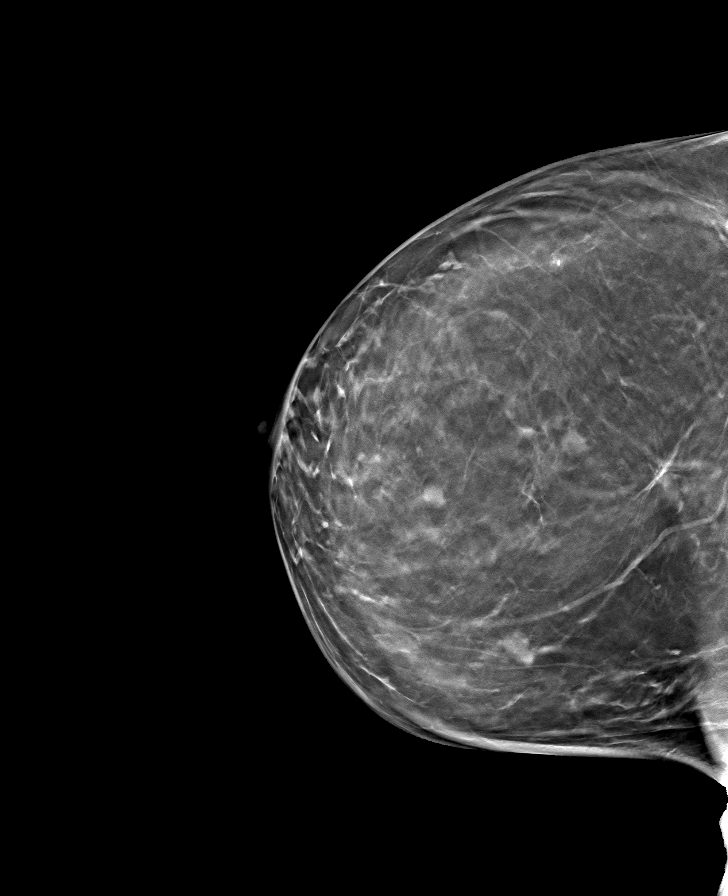

[R MLO tomo · tomo slice 43/84.0]
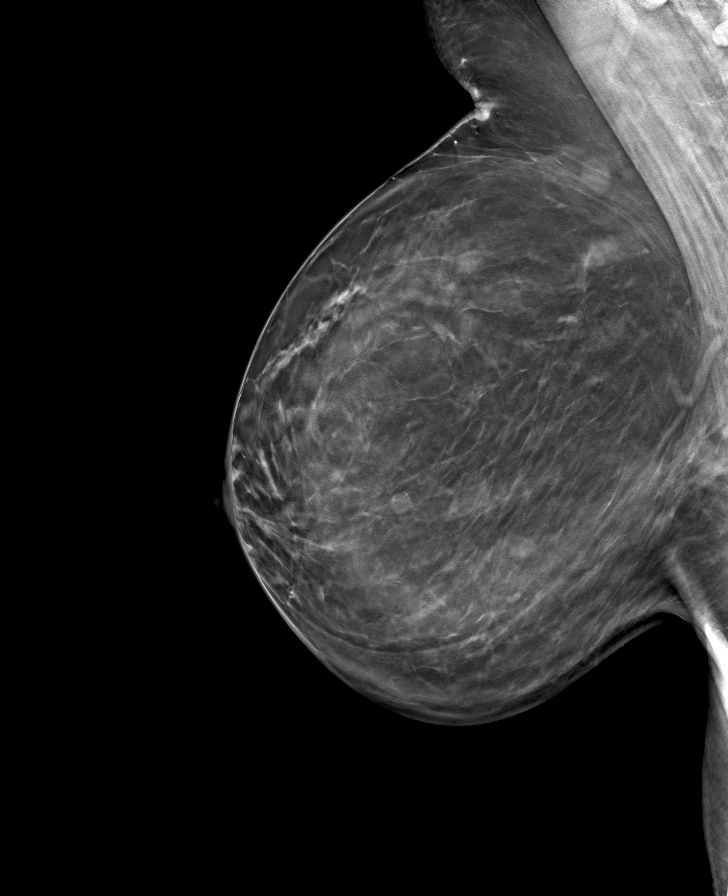

[L MLO tomo · tomo slice 47/92.0]
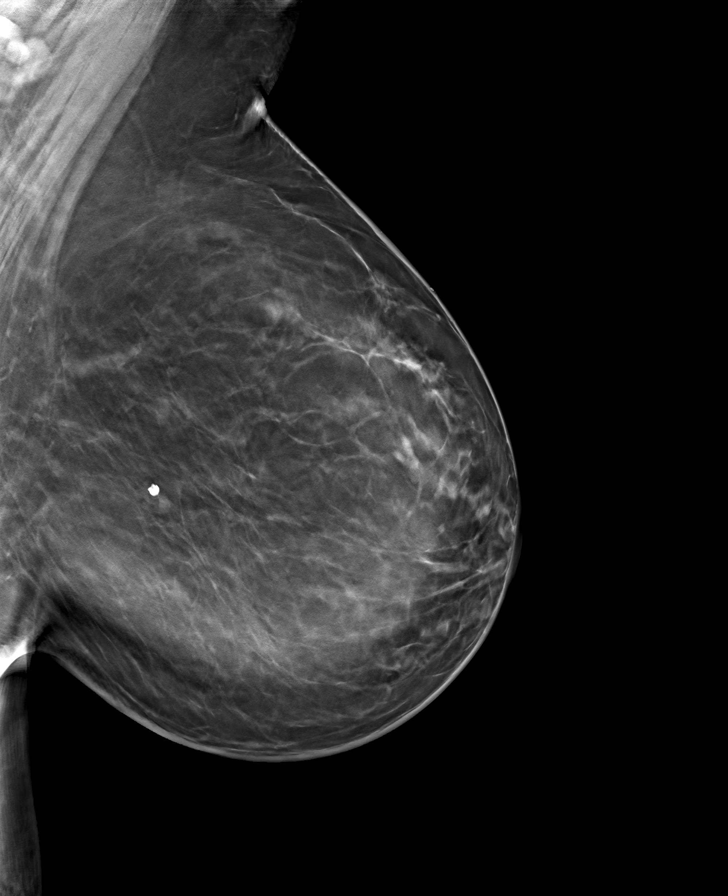

[L CC tomo · tomo slice 37/74.0]
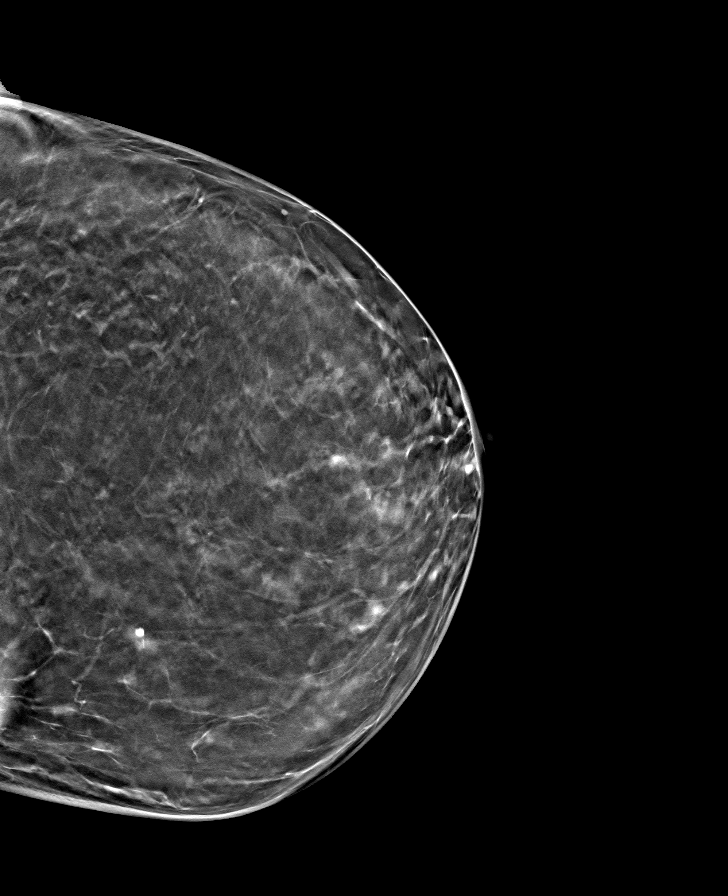

[8 of 24 positions shown; findings below may reference images not displayed]

ACR Breast Density Category b: There are scattered areas of
fibroglandular density.
FINDINGS: There are no findings suspicious for malignancy.
IMPRESSION: No mammographic evidence of malignancy. A result letter of this
screening mammogram will be mailed directly to the patient.

RECOMMENDATION:
Screening mammogram in one year. (Code:51-O-LD2)

BI-RADS CATEGORY  1: Negative.
